# Patient Record
Sex: Male | Born: 1960 | Race: White | Hispanic: No | Smoking: Never smoker
Health system: Southern US, Community
[De-identification: ages and names within clinical notes are randomized; demographics above are authoritative.]

## PROBLEM LIST (undated history)

## (undated) DIAGNOSIS — J9811 Atelectasis: Secondary | ICD-10-CM

## (undated) DIAGNOSIS — M545 Low back pain, unspecified: Secondary | ICD-10-CM

## (undated) DIAGNOSIS — J439 Emphysema, unspecified: Secondary | ICD-10-CM

## (undated) DIAGNOSIS — IMO0002 Reserved for concepts with insufficient information to code with codable children: Secondary | ICD-10-CM

## (undated) DIAGNOSIS — F1010 Alcohol abuse, uncomplicated: Secondary | ICD-10-CM

## (undated) DIAGNOSIS — M75100 Unspecified rotator cuff tear or rupture of unspecified shoulder, not specified as traumatic: Secondary | ICD-10-CM

## (undated) DIAGNOSIS — Z8619 Personal history of other infectious and parasitic diseases: Secondary | ICD-10-CM

## (undated) DIAGNOSIS — K589 Irritable bowel syndrome without diarrhea: Secondary | ICD-10-CM

## (undated) HISTORY — DX: Emphysema, unspecified: J43.9

## (undated) HISTORY — DX: Irritable bowel syndrome without diarrhea: K58.9

## (undated) HISTORY — DX: Atelectasis: J98.11

## (undated) HISTORY — DX: Alcohol abuse, uncomplicated: F10.10

## (undated) HISTORY — PX: EYE SURGERY: SHX253

## (undated) HISTORY — DX: Reserved for concepts with insufficient information to code with codable children: IMO0002

## (undated) HISTORY — DX: Unspecified rotator cuff tear or rupture of unspecified shoulder, not specified as traumatic: M75.100

## (undated) HISTORY — DX: Personal history of other infectious and parasitic diseases: Z86.19

## (undated) HISTORY — DX: Low back pain, unspecified: M54.50

## (undated) HISTORY — DX: Low back pain: M54.5

---

## 2005-03-21 ENCOUNTER — Emergency Department: Payer: Self-pay | Admitting: Emergency Medicine

## 2005-03-22 ENCOUNTER — Other Ambulatory Visit: Payer: Self-pay

## 2005-03-22 ENCOUNTER — Emergency Department: Payer: Self-pay | Admitting: Emergency Medicine

## 2007-05-12 ENCOUNTER — Emergency Department: Payer: Self-pay | Admitting: Emergency Medicine

## 2008-05-27 ENCOUNTER — Ambulatory Visit: Payer: Self-pay | Admitting: Family Medicine

## 2008-08-23 ENCOUNTER — Emergency Department: Payer: Self-pay | Admitting: Emergency Medicine

## 2008-08-26 ENCOUNTER — Ambulatory Visit: Payer: Self-pay | Admitting: Ophthalmology

## 2008-09-09 ENCOUNTER — Ambulatory Visit: Payer: Self-pay | Admitting: Ophthalmology

## 2008-11-30 ENCOUNTER — Emergency Department: Payer: Self-pay | Admitting: Emergency Medicine

## 2008-12-01 ENCOUNTER — Emergency Department: Payer: Self-pay | Admitting: Emergency Medicine
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
## 2009-07-14 ENCOUNTER — Ambulatory Visit: Payer: Self-pay | Admitting: Ophthalmology

## 2009-07-26 ENCOUNTER — Ambulatory Visit: Payer: Self-pay | Admitting: Ophthalmology
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
## 2010-04-08 ENCOUNTER — Ambulatory Visit: Payer: Self-pay | Admitting: General Surgery

## 2010-04-08 HISTORY — PX: SPHINCTEROTOMY: SHX5279

## 2010-04-08 HISTORY — PX: HEMORROIDECTOMY: SUR656

## 2010-04-08 LAB — HM COLONOSCOPY

## 2011-01-07 ENCOUNTER — Inpatient Hospital Stay: Payer: Self-pay | Admitting: Psychiatry

## 2011-07-31 ENCOUNTER — Encounter: Payer: Self-pay | Admitting: Pulmonary Disease

## 2011-08-01 ENCOUNTER — Encounter: Payer: Self-pay | Admitting: Pulmonary Disease

## 2011-08-01 ENCOUNTER — Ambulatory Visit (INDEPENDENT_AMBULATORY_CARE_PROVIDER_SITE_OTHER): Payer: 59 | Admitting: Pulmonary Disease

## 2011-08-01 VITALS — BP 150/98 | HR 77 | Temp 97.9°F | Ht 71.0 in | Wt 218.4 lb

## 2011-08-01 DIAGNOSIS — R9389 Abnormal findings on diagnostic imaging of other specified body structures: Secondary | ICD-10-CM | POA: Insufficient documentation

## 2011-08-01 DIAGNOSIS — R05 Cough: Secondary | ICD-10-CM

## 2011-08-01 DIAGNOSIS — J439 Emphysema, unspecified: Secondary | ICD-10-CM

## 2011-08-01 DIAGNOSIS — R918 Other nonspecific abnormal finding of lung field: Secondary | ICD-10-CM

## 2011-08-01 NOTE — Assessment & Plan Note (Signed)
Chronic cough, or cough lasting longer than 8 weeks is due to any of the following conditions in nearly 95% of cases: upper airway cough syndrome, GERD, asthma, chronic bronchitis, bronchiectasis, and non asthmatic eosinophilic bronchitis.  Smoking and ACE-Inhibitors are other common causes to consider.    Upper Airway Cough Syndrome, or UACS, is caused by either rhinitis (allergic, drug induced, vasomotor rhinitis, environmental irritant), or sinusitis (inflammatory, bacterial, or other infectious).  Effective forms of therapies include nasal steroids, antihistamines, decongestants, ipratropium nasal spray, and saline rinses.  Often the diagnosis is confirmed by response to treatment, and failure to respond to therapy necessitates further diagnostic work up such as CT imaging of the sinuses or 24 hour esophageal impedence studies.    Mr. Douglas Brooks cough is likely due to his ongoing sinus congestion and drainage as well as acid reflux.  I have advised that he start using saline rinses, and chlortrimeton with a decongestant.  I have also recommended that he use delsym as needed.  We gave him lifestyle instructions for his acid reflux and have recommended that he start taking omeprazole 20mg  daily.

## 2011-08-01 NOTE — Patient Instructions (Signed)
We will send you for a scan of your chest at [facility] and call you with the results.  Use Neil Med rinses with distilled water at least twice per day using the instructions on the package. 1/2 hour after using the [facility] Med rinse, use Nasonex two puffs in each nostril once per day. Use chlortrimeton and an over the counter decongestant (ask the pharmacist for a recommendation) as needed for the cough. If you are not better in 2 weeks, then start taking Prilosec 20mg  oral daily. Follow the reflux diet we have provided you. Use Delsym as needed for cough.  We will see you back in 4-6 weeks.

## 2011-08-01 NOTE — Assessment & Plan Note (Signed)
Unfortunately I am not able to view the 13cm bullae that radiology saw on the 06/2011 CXR. However, I am able to see a 2008 CXR from [facility] which shows no such lesion.  I explained to him that I think that this likely represents a healing infection from a prior cavitary pneumonia, but could represent a malignancy or much less likely a more rare cyst forming process in the lung [facility] [place], dermatofibrosarcoma, etc.).    Because he drinks regularly and tells me that he has passed out from drinking at least once since 2008, I favor this representing the resolution of a cavitary pneumonia.  We will order a CT scan to better characterize the lesion and to rule out a mass.  He may need follow up with thoracic surgery.

## 2011-08-01 NOTE — Progress Notes (Signed)
Subjective:    Patient ID: Douglas Brooks, male    DOB: 1961/05/14, 51 y.o.   MRN: [id number]  HPI 51 y/o male with no known lung disease presents to our clinic for evaluation of cough and an abnormal chest x-ray.  He states that he had a normal childhood without significant respiratory illnesses and has never been diagnosed with pneumonia or other respiratory problems.  However in November of 2012 he noted the onset of flu like symptoms (fever, chills, body aches) and eventually sought care with his primary care physician.  He was treated empirically with augmentin, and then when his symptoms did not improve he was treated several weeks later with Levaquin (07/12/2011).  He completed this course but was referred to [place] because a CXR peformed at that time showed a large, 13cm bullae in his left lower lobe with associated atelectasis.  He notes that the cough and chest congestion have not completely resolved. He notes continuous sinus congestion but does not take anything for this.  He states that he has reflux symptoms several times per month, and takes a zantac for it as needed.  He notes an itching sensation in his throat and states that his chest congestion and sputum production worsens whenever he is recumbent.  Past Medical History  Diagnosis Date  . Bulla of lung   . Atelectasis of left lung   . Lumbago   . Other specified disorders of rotator cuff syndrome of shoulder and allied disorders   . Alcohol abuse, unspecified   . Depressive disorder   . Lateral epicondylitis  of elbow   . IBS (irritable bowel syndrome)   . Mixed hyperlipidemia      No family history on file.   History   Social History  . Marital Status: Single    Spouse Name: N/A    Number of Children: 2  . Years of Education: N/A   Occupational History  . 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 Smoking status: Current Some Day Smoker  . Smokeless tobacco: Never Used   Comment: smokes very  occ   . Alcohol Use: No     prior ETOH abuse  . Drug Use: No  . Sexually Active: Not on file   Other Topics Concern  . Not on file   Social History Narrative  . No narrative on file     No Known Allergies   No outpatient prescriptions prior to visit.    Review of Systems  Constitutional: Positive for appetite change. Negative for fever, chills, activity change and unexpected weight change.  HENT: Negative for congestion, sore throat, rhinorrhea, sneezing, trouble swallowing, dental problem, voice change and postnasal drip.   Eyes: Negative for visual disturbance.  Respiratory: Positive for cough. Negative for choking and shortness of breath.   Cardiovascular: Negative for chest pain and leg swelling.  Gastrointestinal: Negative for nausea, vomiting and abdominal pain.  Genitourinary: Negative for difficulty urinating.  Musculoskeletal: Negative for arthralgias.  Skin: Negative for rash.  Psychiatric/Behavioral: Negative for behavioral problems and confusion.      Objective:   Physical Exam Filed Vitals:   08/01/11 1602  BP: 150/98  Pulse: 77  Temp: 97.9 F (36.6 C)  TempSrc: Oral  Height: 5\' 11"  (1.803 m)  Weight: 218 lb 6.4 oz (99.066 kg)  SpO2: 93%   Gen: overweight white male, no acute distress HEENT: NCAT, PERRL, EOMi, OP clear, TM's clear Neck supple without masses PULM: Few insp  crackles in left base, otherwise clear CV: RRR, no mgr, no JVD AB: BS+, soft, nontender, no hsm Ext: warm, no edema, no clubbing, no cyanosis Derm: no rash or skin breakdown Neuro: A&Ox4, CN II-XII intact, strength 5/5 in all 4 extremities  Review of 2008 CXR: no acute lung disease  The Jan 2013 CXR from [facility] notes a 13cm cyst with associated atelectasis in the left lower lobe     Assessment & Plan:   Left lower lobe bullae Unfortunately I am not able to view the 13cm bullae that radiology saw on the 06/2011 CXR. However, I am able to see a 2008 CXR from [facility]  which shows no such lesion.  I explained to him that I think that this likely represents a healing infection from a prior cavitary pneumonia, but could represent a malignancy or much less likely a more rare cyst forming process in the lung [facility] [place], dermatofibrosarcoma, etc.).    Because he drinks regularly and tells me that he has passed out from drinking at least once since 2008, I favor this representing the resolution of a cavitary pneumonia.  We will order a CT scan to better characterize the lesion and to rule out a mass.  He may need follow up with thoracic surgery.  Cough Chronic cough, or cough lasting longer than 8 weeks is due to any of the following conditions in nearly 95% of cases: upper airway cough syndrome, GERD, asthma, chronic bronchitis, bronchiectasis, and non asthmatic eosinophilic bronchitis.  Smoking and ACE-Inhibitors are other common causes to consider.    Upper Airway Cough Syndrome, or UACS, is caused by either rhinitis (allergic, drug induced, vasomotor rhinitis, environmental irritant), or sinusitis (inflammatory, bacterial, or other infectious).  Effective forms of therapies include nasal steroids, antihistamines, decongestants, ipratropium nasal spray, and saline rinses.  Often the diagnosis is confirmed by response to treatment, and failure to respond to therapy necessitates further diagnostic work up such as CT imaging of the sinuses or 24 hour esophageal impedence studies.    Mr. Babette Relic cough is likely due to his ongoing sinus congestion and drainage as well as acid reflux.  I have advised that he start using saline rinses, and chlortrimeton with a decongestant.  I have also recommended that he use delsym as needed.  We gave him lifestyle instructions for his acid reflux and have recommended that he start taking omeprazole 20mg  daily.    Updated Medication List Outpatient Encounter Prescriptions as of 08/01/2011  Medication Sig Dispense Refill  . ABILIFY  2 MG tablet Take 1 tablet by mouth daily.      Marland Kitchen LORazepam (ATIVAN) 0.5 MG tablet Take 1 tablet by mouth Twice daily as needed.      . sertraline (ZOLOFT) 100 MG tablet Take 1 tablet by mouth Twice daily.

## 2011-08-04 ENCOUNTER — Ambulatory Visit: Payer: Self-pay | Admitting: Pulmonary Disease

## 2011-08-07 ENCOUNTER — Telehealth: Payer: Self-pay | Admitting: Pulmonary Disease

## 2011-08-07 NOTE — Telephone Encounter (Signed)
Spoke with pt. I advised that Dr. Kendrick Fries out of the office until 08/21/11 and I will forward him msg to let him know that he is requesting results. Please advise, thanks!

## 2011-08-07 NOTE — Telephone Encounter (Signed)
Pt called wants to get results of his ct he had done friday 28/13 [phone]

## 2011-08-08 ENCOUNTER — Telehealth: Payer: Self-pay | Admitting: Pulmonary Disease

## 2011-08-08 NOTE — Telephone Encounter (Signed)
Called pt this morning to discuss CT results.  He needs a f/u CT and appointment in 3 months.

## 2011-08-08 NOTE — Telephone Encounter (Signed)
I called Douglas Brooks to let him know that his CT at [facility] was remarkably normal.  He had a few areas of atelectasis that need follow up in 3 months.

## 2011-08-28 ENCOUNTER — Telehealth: Payer: Self-pay | Admitting: *Deleted

## 2011-08-28 DIAGNOSIS — J9811 Atelectasis: Secondary | ICD-10-CM

## 2011-08-28 NOTE — Telephone Encounter (Signed)
Message copied by Christen Butter on Mon Aug 28, 2011  3:56 PM ------      Message from: Tanna Savoy      Created: Tue Aug 08, 2011  8:06 AM       L,            I called Mr. Germano to let him know his CT looked OK.  He needs a follow up CT chest in 3 months with an office visit.  Reason for CT: follow up left lower lobe atelectasis seen on 07/2011 study.            He doesn't need to see me until after the CT.            Thanks,      B

## 2011-08-28 NOTE — Telephone Encounter (Signed)
LMTCB- I already went ahead and placed order for CT Chest to be done in May

## 2011-08-29 NOTE — Telephone Encounter (Signed)
LMTCB for pt 

## 2011-08-30 ENCOUNTER — Ambulatory Visit (INDEPENDENT_AMBULATORY_CARE_PROVIDER_SITE_OTHER): Payer: 59 | Admitting: Pulmonary Disease

## 2011-08-30 ENCOUNTER — Encounter: Payer: Self-pay | Admitting: *Deleted

## 2011-08-30 DIAGNOSIS — R918 Other nonspecific abnormal finding of lung field: Secondary | ICD-10-CM

## 2011-08-30 NOTE — Telephone Encounter (Signed)
LMOM for pt that he really does not need to see [place] until after f/u CT Chest in May- he has appt scheduled for this pm however. Asked him to call back asap so we can discuss f/u for May.

## 2011-08-30 NOTE — Telephone Encounter (Signed)
Pt did not show up for his appt today, so letter was mailed to him asking him to call us so that we can coordinate ct chest and ov for May.

## 2011-08-30 NOTE — Progress Notes (Deleted)
  Subjective:    Patient ID: Douglas Brooks, male    DOB: 07-Feb-1961, 52 y.o.   MRN: [id number]  Synopsis: Mr. Douglas Brooks was referred to the [facility] clinic in January 2013 for evaluation of a large left bullae which was noted on CXR following a respiratory infection.  He underwent a CT scan after we saw him and no such bullae could be seen, but there were some small areas of atelectasis and scarring in the left lower lobe.  He was also seen for cough which was felt to be due to UACS and GERD so antihistamine/decongestant and a PPI were prescribed.  HPI 08/30/11 ROV --  Past Medical History  Diagnosis Date  . Bulla of lung   . Atelectasis of left lung   . Lumbago   . Other specified disorders of rotator cuff syndrome of shoulder and allied disorders   . Alcohol abuse, unspecified   . Depressive disorder   . Lateral epicondylitis  of elbow   . IBS (irritable bowel syndrome)   . Mixed hyperlipidemia       Outpatient Prescriptions Prior to Visit  Medication Sig Dispense Refill  . ABILIFY 2 MG tablet Take 1 tablet by mouth daily.      Marland Kitchen LORazepam (ATIVAN) 0.5 MG tablet Take 1 tablet by mouth Twice daily as needed.      . sertraline (ZOLOFT) 100 MG tablet Take 1 tablet by mouth Twice daily.        Review of Systems  Constitutional: Positive for appetite change. Negative for fever, chills, activity change and unexpected weight change.  HENT: Negative for congestion, sore throat, rhinorrhea, sneezing, trouble swallowing, dental problem, voice change and postnasal drip.   Eyes: Negative for visual disturbance.  Respiratory: Positive for cough. Negative for choking and shortness of breath.   Cardiovascular: Negative for chest pain and leg swelling.  Gastrointestinal: Negative for nausea, vomiting and abdominal pain.  Genitourinary: Negative for difficulty urinating.  Musculoskeletal: Negative for arthralgias.  Skin: Negative for rash.  Psychiatric/Behavioral: Negative for  behavioral problems and confusion.      Objective:   Physical Exam  There were no vitals filed for this visit. Gen: overweight white male, no acute distress HEENT: NCAT, PERRL, EOMi, OP clear, TM's clear Neck supple without masses PULM: Few insp crackles in left base, otherwise clear CV: RRR, no mgr, no JVD AB: BS+, soft, nontender, no hsm Ext: warm, no edema, no clubbing, no cyanosis Derm: no rash or skin breakdown Neuro: A&Ox4, CN II-XII intact, strength 5/5 in all 4 extremities  Review of 2008 CXR: no acute lung disease  The Jan 2013 CXR from [facility] notes a 13cm cyst with associated atelectasis in the left lower lobe     Assessment & Plan:   No problem-specific assessment & plan notes found for this encounter.   Updated Medication List Outpatient Encounter Prescriptions as of 08/30/2011  Medication Sig Dispense Refill  . ABILIFY 2 MG tablet Take 1 tablet by mouth daily.      Marland Kitchen LORazepam (ATIVAN) 0.5 MG tablet Take 1 tablet by mouth Twice daily as needed.      . sertraline (ZOLOFT) 100 MG tablet Take 1 tablet by mouth Twice daily.

## 2011-08-31 ENCOUNTER — Encounter: Payer: Self-pay | Admitting: Pulmonary Disease

## 2011-10-11 NOTE — Progress Notes (Signed)
No show

## 2011-10-27 ENCOUNTER — Telehealth: Payer: Self-pay | Admitting: Pulmonary Disease

## 2011-10-27 NOTE — Telephone Encounter (Signed)
I called and spoke with Douglas Brooks and explained that f/u on this lesion is important and despite that his cough has resolved, and he feels well, the CT Chest should be done for completeness, and to be sure that whatever was going on on prior films has indeed resolved and make sure there is no mass there. Douglas Brooks verbalized understanding of this, and states "oh, it'll be all right, I am fine". He does not want the ct done. Will forward to Dr. Kendrick Fries to make him aware.

## 2011-10-30 NOTE — Telephone Encounter (Signed)
noted 

## 2015-03-13 ENCOUNTER — Other Ambulatory Visit: Payer: Self-pay | Admitting: Family Medicine

## 2015-03-14 NOTE — Telephone Encounter (Signed)
May call in 30 day refill for lorazepam, patient needs to schedule office visit before any additional refills can be approved.

## 2015-03-15 NOTE — Telephone Encounter (Signed)
Rx called into pharmacy. Also left message on vm for pt to call back to schedule office visit.

## 2015-03-31 ENCOUNTER — Telehealth: Payer: Self-pay

## 2015-03-31 ENCOUNTER — Encounter: Payer: Self-pay | Admitting: Family Medicine

## 2015-03-31 ENCOUNTER — Ambulatory Visit (INDEPENDENT_AMBULATORY_CARE_PROVIDER_SITE_OTHER): Payer: Self-pay | Admitting: Family Medicine

## 2015-03-31 VITALS — BP 160/84 | HR 59 | Temp 98.8°F | Resp 16 | Ht 71.0 in | Wt 209.0 lb

## 2015-03-31 DIAGNOSIS — E782 Mixed hyperlipidemia: Secondary | ICD-10-CM

## 2015-03-31 DIAGNOSIS — F1011 Alcohol abuse, in remission: Secondary | ICD-10-CM | POA: Insufficient documentation

## 2015-03-31 DIAGNOSIS — R030 Elevated blood-pressure reading, without diagnosis of hypertension: Secondary | ICD-10-CM

## 2015-03-31 DIAGNOSIS — E781 Pure hyperglyceridemia: Secondary | ICD-10-CM | POA: Insufficient documentation

## 2015-03-31 DIAGNOSIS — F32A Depression, unspecified: Secondary | ICD-10-CM | POA: Insufficient documentation

## 2015-03-31 DIAGNOSIS — F329 Major depressive disorder, single episode, unspecified: Secondary | ICD-10-CM

## 2015-03-31 DIAGNOSIS — M545 Low back pain, unspecified: Secondary | ICD-10-CM | POA: Insufficient documentation

## 2015-03-31 DIAGNOSIS — K648 Other hemorrhoids: Secondary | ICD-10-CM | POA: Insufficient documentation

## 2015-03-31 DIAGNOSIS — I10 Essential (primary) hypertension: Secondary | ICD-10-CM | POA: Insufficient documentation

## 2015-03-31 DIAGNOSIS — M67919 Unspecified disorder of synovium and tendon, unspecified shoulder: Secondary | ICD-10-CM | POA: Insufficient documentation

## 2015-03-31 DIAGNOSIS — K589 Irritable bowel syndrome without diarrhea: Secondary | ICD-10-CM | POA: Insufficient documentation

## 2015-03-31 DIAGNOSIS — F1010 Alcohol abuse, uncomplicated: Secondary | ICD-10-CM

## 2015-03-31 DIAGNOSIS — R9389 Abnormal findings on diagnostic imaging of other specified body structures: Secondary | ICD-10-CM | POA: Insufficient documentation

## 2015-03-31 DIAGNOSIS — IMO0001 Reserved for inherently not codable concepts without codable children: Secondary | ICD-10-CM

## 2015-03-31 DIAGNOSIS — G629 Polyneuropathy, unspecified: Secondary | ICD-10-CM

## 2015-03-31 MED ORDER — SERTRALINE HCL 100 MG PO TABS: 100.0000 mg | ORAL_TABLET | Freq: Every day | ORAL | Status: DC

## 2015-03-31 MED ORDER — LORAZEPAM 0.5 MG PO TABS: 0.5000 mg | ORAL_TABLET | Freq: Two times a day (BID) | ORAL | Status: DC

## 2015-03-31 NOTE — Progress Notes (Signed)
Patient: Douglas Brooks Male    DOB: 26-Oct-1960   54 y.o.   MRN: [id number] Visit Date: 03/31/2015  Today's Provider: Mila Merry, MD   Chief Complaint  Patient presents with  . Numbness    left arm   Subjective:    HPI  Started having gradual onset of numbness and burning on the bottoms of both feet about a month ago. Affects both feet equally. Occasionally fingers feel numb, more so on left. Occurs at rest and when on feet. No known injury. Has a sensation like having pieces of corn rubbing on the bottom of his forefoot.  Allergies  Allergen Reactions  . Crestor [Rosuvastatin Calcium]   . Fluvastatin Sodium Other (See Comments)    Muscle pain and weakness  . Lovastatin    Previous Medications   ABILIFY 2 MG TABLET    Take 1 tablet by mouth daily.   LORAZEPAM (ATIVAN) 0.5 MG TABLET    take 1 tablet by mouth twice a day   SERTRALINE (ZOLOFT) 100 MG TABLET    Take 1 tablet by mouth Twice daily.   Family Status  Relation Status Death Age  . Mother Alive   . Father Alive   . Sister Alive   . Brother Alive   . Brother Alive   . Sister Alive   . Sister Alive     His family history includes Hyperlipidemia in his mother.  Review of Systems  Respiratory: Negative for cough, chest tightness and shortness of breath.   Cardiovascular: Negative for chest pain and palpitations.  Musculoskeletal: Negative for back pain, neck pain and neck stiffness.  Neurological: Positive for weakness and numbness. Negative for dizziness, light-headedness and headaches.       Tingling in left hand with some numbness. Also, bilateral numbness and tingling in feet.    Social History  Substance Use Topics  . Smoking status: Never Smoker   . Smokeless tobacco: Never Used     Comment: smokes very occ   . Alcohol Use: 0.0 oz/week    0 Standard drinks or equivalent per week     Comment: occasional use; prior ETOH abuse   Objective:   BP 160/84 mmHg  Pulse 59  Temp(Src) 98.8 F  (37.1 C) (Oral)  Resp 16  Ht  (1.803 m)  Wt 209 lb (94.802 kg)  BMI 29.16 kg/m2  SpO2 98%  Physical Exam  General Appearance:    Alert, cooperative, no distress  Eyes:    PERRL, conjunctiva/corneas clear, EOM's intact       Lungs:     Clear to auscultation bilaterally, respirations unlabored  Heart:    Regular rate and rhythm  Neurologic:   Awake, alert, oriented x 3. Decrease somato-sensation of . Plantar aspects of forefoot of both feet. No gross deformities. No erythema.   Vasc:   No edema, +2 pedal pulses. Cap refill about 4 seconds.       FSBS=91    Assessment & Plan:     1. Neuropathy (HCC) No clear etiology. Denies recent alcohol use.  - Comprehensive metabolic panel - CBC - Vit D  25 hydroxy (rtn osteoporosis monitoring) - B12 and Folate Panel  Consider trail of thiamine if labs normal.   2. Depressive disorder He states sertraline is working well and requests refill for this and lorazepam.   3. Elevated blood pressure Consider ASA and antihypertensive after reviewing labs.        Mila Merry,  MD  [place]

## 2015-03-31 NOTE — Telephone Encounter (Signed)
Patient called saying that he has had some numbness in his left leg for about 3 weeks, and now he is experiencing numbness and tingling in his left arm. Patient also reports that he is having shortness of breath with exertion, along with some mild upper back pain. Patient denies any chest pain, dizziness, nausea, or lightheadedness. I recommended to patient that he be seen in the ER, but patient declined. Patient wanted to come into the office to be seen. Advised patient that the earliest appt wasn't until 10:45a. Patient reports that he would like to be seen by Dr. Sherrie Mustache first. Scheduled patient an appt to be seen today. Advised patient if he develops severe chest pain, dizziness, or shortness of breath, he should go to the ER.

## 2015-04-01 DIAGNOSIS — G629 Polyneuropathy, unspecified: Secondary | ICD-10-CM | POA: Insufficient documentation

## 2015-04-01 LAB — COMPREHENSIVE METABOLIC PANEL
ALT: 25 IU/L (ref 0–44)
AST: 22 IU/L (ref 0–40)
Albumin/Globulin Ratio: 2 (ref 1.1–2.5)
Albumin: 4.9 g/dL (ref 3.5–5.5)
Alkaline Phosphatase: 77 IU/L (ref 39–117)
BUN/Creatinine Ratio: 15 (ref 9–20)
BUN: 11 mg/dL (ref 6–24)
Bilirubin Total: 0.8 mg/dL (ref 0.0–1.2)
CALCIUM: 9.9 mg/dL (ref 8.7–10.2)
CO2: 26 mmol/L (ref 18–29)
CREATININE: 0.75 mg/dL — AB (ref 0.76–1.27)
Chloride: 100 mmol/L (ref 97–108)
GFR calc Af Amer: 119 mL/min/{1.73_m2} (ref >59)
GFR, EST NON AFRICAN AMERICAN: 103 mL/min/{1.73_m2} (ref >59)
Globulin, Total: 2.5 g/dL (ref 1.5–4.5)
Glucose: 109 mg/dL — ABNORMAL HIGH (ref 65–99)
Potassium: 5.6 mmol/L — ABNORMAL HIGH (ref 3.5–5.2)
Sodium: 140 mmol/L (ref 134–144)
Total Protein: 7.4 g/dL (ref 6.0–8.5)

## 2015-04-01 LAB — B12 AND FOLATE PANEL
Folate: 12 ng/mL (ref >3.0)
Vitamin B-12: 376 pg/mL (ref 211–946)

## 2015-04-01 LAB — CBC
HEMOGLOBIN: 15.9 g/dL (ref 12.6–17.7)
Hematocrit: 45.4 % (ref 37.5–51.0)
MCH: 32.1 pg (ref 26.6–33.0)
MCHC: 35 g/dL (ref 31.5–35.7)
MCV: 92 fL (ref 79–97)
Platelets: 194 10*3/uL (ref 150–379)
RBC: 4.95 x10E6/uL (ref 4.14–5.80)
RDW: 13.6 % (ref 12.3–15.4)
WBC: 5.2 10*3/uL (ref 3.4–10.8)

## 2015-04-01 LAB — VITAMIN D 25 HYDROXY (VIT D DEFICIENCY, FRACTURES): VIT D 25 HYDROXY: 22.6 ng/mL — AB (ref 30.0–100.0)

## 2015-05-27 ENCOUNTER — Telehealth: Payer: Self-pay

## 2015-05-27 NOTE — Telephone Encounter (Signed)
Douglas [facility] stating that patient is being seen in the office with possible pneumonia. She reports that his blood pressure is really high. She is asking if they can go ahead and start a low dose antihypertensive medication for his BP. Discussed message with Dr. Sullivan LoneGilbert and he reports that it would be ok. Patient was started on HCTZ 12.5mg  daily and she Mel Almond(Douglas Brooks) reports that patient will follow up with our office in 1-2 days for re-evaluation.

## 2015-06-15 ENCOUNTER — Telehealth: Payer: Self-pay | Admitting: Family Medicine

## 2015-06-15 NOTE — Telephone Encounter (Signed)
Please review-aa 

## 2015-06-15 NOTE — Telephone Encounter (Signed)
Pt called saying the neuropathy in his feet has gotten really bad.  His toes have become somewhat numb and cold.  He said he was in a lot of pain.  He does not have insurance and said he was just in about a month ago for this.  It's just getting worse.  His call back is [phone]  Thanks Barth Kirkseri

## 2015-06-16 MED ORDER — NORTRIPTYLINE HCL 25 MG PO CAPS: 25.0000 mg | ORAL_CAPSULE | Freq: Every day | ORAL | Status: DC

## 2015-06-16 NOTE — Telephone Encounter (Signed)
LMOVM for pt to return call 

## 2015-06-16 NOTE — Telephone Encounter (Signed)
Patient was notified. Patient expressed understanding. Rx was sent to pharmacy. 

## 2015-06-16 NOTE — Telephone Encounter (Signed)
Can start nortriptyline 25mg  at bedtime, #30, rf x 1. Make sure he taking the vitamin d supplement 2000 units a day because his vitamin d level was very low which can aggravate neuropathy  His BP was high at his last visit and needs to schedule follow up in one to follow up on new medication and blood pressure.

## 2015-09-15 ENCOUNTER — Ambulatory Visit (INDEPENDENT_AMBULATORY_CARE_PROVIDER_SITE_OTHER): Payer: Self-pay | Admitting: Family Medicine

## 2015-09-15 ENCOUNTER — Encounter: Payer: Self-pay | Admitting: Family Medicine

## 2015-09-15 VITALS — BP 185/116 | HR 81 | Temp 98.6°F | Resp 16 | Ht 71.0 in | Wt 210.0 lb

## 2015-09-15 DIAGNOSIS — I10 Essential (primary) hypertension: Secondary | ICD-10-CM

## 2015-09-15 DIAGNOSIS — G629 Polyneuropathy, unspecified: Secondary | ICD-10-CM

## 2015-09-15 DIAGNOSIS — E559 Vitamin D deficiency, unspecified: Secondary | ICD-10-CM

## 2015-09-15 MED ORDER — LORAZEPAM 0.5 MG PO TABS: 0.5000 mg | ORAL_TABLET | Freq: Two times a day (BID) | ORAL | Status: DC

## 2015-09-15 MED ORDER — LISINOPRIL-HYDROCHLOROTHIAZIDE 10-12.5 MG PO TABS: 1.0000 | ORAL_TABLET | Freq: Every day | ORAL | Status: DC

## 2015-09-15 MED ORDER — NORTRIPTYLINE HCL 25 MG PO CAPS: 25.0000 mg | ORAL_CAPSULE | Freq: Every day | ORAL | Status: DC

## 2015-09-15 NOTE — Progress Notes (Signed)
Patient: Douglas Brooks Male    DOB: 09/19/1960   55 y.o.   MRN: [id number] Visit Date: 09/15/2015  Today's Provider: Mila Merryonald Aliayah Tyer, MD   Chief Complaint  Patient presents with  . Blood Pressure Check   Subjective:    HPI  Patient was seen in dentist office yesterday for a toothache. Dentist was going to extract the tooth but patient's blood pressure was elevated to 190/107. Dentist will not extract te tooth until his blood pressure is lower and advised him to see his provider today. He has not been following low sodium diet and not exercising.    Allergies  Allergen Reactions  . Crestor [Rosuvastatin Calcium]   . Fluvastatin Sodium Other (See Comments)    Muscle pain and weakness  . Lovastatin    Previous Medications   HYDROCODONE-ACETAMINOPHEN (NORCO/VICODIN) 5-325 MG TABLET    Take 1 tablet by mouth every 6 (six) hours as needed for moderate pain.   LORAZEPAM (ATIVAN) 0.5 MG TABLET    Take 1 tablet (0.5 mg total) by mouth 2 (two) times daily.   NORTRIPTYLINE (PAMELOR) 25 MG CAPSULE    Take 1 capsule (25 mg total) by mouth at bedtime.    Review of Systems  Constitutional: Negative for fever, chills and appetite change.  Respiratory: Negative for chest tightness, shortness of breath and wheezing.   Cardiovascular: Negative for chest pain and palpitations.  Gastrointestinal: Negative for nausea, vomiting and abdominal pain.    Social History  Substance Use Topics  . Smoking status: Never Smoker   . Smokeless tobacco: Never Used     Comment: smokes very occ   . Alcohol Use: 0.0 oz/week    0 Standard drinks or equivalent per week     Comment: occasional use; prior ETOH abuse   Objective:     Filed Vitals:   09/15/15 1506 09/15/15 1601  BP: 170/110 185/116  Pulse: 81   Temp: 98.6 F (37 C)   TempSrc: Oral   Resp: 16   Height: 5\' 11"  (1.803 m)   Weight: 210 lb (95.255 kg)   SpO2: 97%      Physical Exam   General Appearance:    Alert,  cooperative, no distress  Eyes:    PERRL, conjunctiva/corneas clear, EOM's intact       Lungs:     Clear to auscultation bilaterally, respirations unlabored  Heart:    Regular rate and rhythm  Neurologic:   Awake, alert, oriented x 3. No apparent focal neurological           defect.            Assessment & Plan:     1. Essential hypertension Discussed low sodium, high fruit and vegatable diet, work on losing weight. Start lisinopril-hctz. Return after the weekend for BP check. May not have dental procedure until BP remains <180/100.  - lisinopril-hydrochlorothiazide (PRINZIDE,ZESTORETIC) 10-12.5 MG tablet; Take 1 tablet by mouth daily.  Dispense: 30 tablet; Refill: 1 - LORazepam (ATIVAN) 0.5 MG tablet; Take 1 tablet (0.5 mg total) by mouth 2 (two) times daily.  Dispense: 60 tablet; Refill: 1  2. Vitamin D deficiency On vitamin d supplements per patient report  3. Neuropathy (HCC) Nortriptyline working well. Stay on vitamin d replacement. - nortriptyline (PAMELOR) 25 MG capsule; Take 1 capsule (25 mg total) by mouth at bedtime.  Dispense: 30 capsule; Refill: 1        Mila Merryonald Calee Nugent, MD  [facility]

## 2015-10-01 ENCOUNTER — Other Ambulatory Visit: Payer: Self-pay | Admitting: Family Medicine

## 2015-10-01 NOTE — Telephone Encounter (Signed)
Please call in lorazepam.  

## 2015-10-01 NOTE — Telephone Encounter (Signed)
Prescription phoned into pharmacy.

## 2015-12-29 ENCOUNTER — Ambulatory Visit: Payer: Self-pay | Admitting: Family Medicine

## 2015-12-29 ENCOUNTER — Ambulatory Visit (INDEPENDENT_AMBULATORY_CARE_PROVIDER_SITE_OTHER): Payer: Self-pay | Admitting: Family Medicine

## 2015-12-29 ENCOUNTER — Encounter: Payer: Self-pay | Admitting: Family Medicine

## 2015-12-29 VITALS — BP 130/80 | HR 92 | Temp 97.9°F | Resp 16 | Ht 71.0 in | Wt 201.0 lb

## 2015-12-29 DIAGNOSIS — R109 Unspecified abdominal pain: Secondary | ICD-10-CM

## 2015-12-29 DIAGNOSIS — M62838 Other muscle spasm: Secondary | ICD-10-CM

## 2015-12-29 LAB — POCT URINALYSIS DIPSTICK
BILIRUBIN UA: NEGATIVE
Ketones, UA: NEGATIVE
LEUKOCYTES UA: NEGATIVE
NITRITE UA: NEGATIVE
PH UA: 6
PROTEIN UA: NEGATIVE
Spec Grav, UA: 1.025
UROBILINOGEN UA: 0.2

## 2015-12-29 MED ORDER — HYOSCYAMINE SULFATE 0.125 MG PO TABS
0.1250 mg | ORAL_TABLET | Freq: Four times a day (QID) | ORAL | Status: DC | PRN
Start: 2015-12-29 — End: 2019-01-13

## 2015-12-29 MED ORDER — MELOXICAM 15 MG PO TABS: 15.0000 mg | ORAL_TABLET | Freq: Every day | ORAL | Status: AC

## 2015-12-29 NOTE — Progress Notes (Signed)
Patient: Douglas Brooks Male    DOB: September 11, 1960   55 y.o.   MRN: [id number] Visit Date: 12/29/2015  Today's Provider: Lelon Huh, MD   Chief Complaint  Patient presents with  . Flank Pain    left   Subjective:     HPI   Follow up ER visit  Patient was seen in [facility] ER for left side flank pain on 12/26/2015. He was treated for Diverticulitis. Treatment for this included CT abdominal scan- Colonic diverticulosis without evidence of diverticulitis. Labs, ekg and UA. Had normal CBC, normal met c except slightly low protein.  Patient was prescribed metroNIDAZOLE (FLAGYL) 500 MG tablet and ciprofloxacin HCl (CIPRO) 500 MG tablet. He reports good compliance with treatment. He reports this condition is Worse. Pain start right lower abdomen, then migrated to left and went to ER on 7/2. Pain has since has since been migrating back and forth between right and left lower quadrant. Is now hurting all across lower abdomen. Had diarrhea the first couple of days, but has normal BM the last 2 days. Has been taking OTC tylenol without adverse effects. Eating makes him nauseated, but does not exacerbate pain. He also states he has been having occasional episodes of spasms in abdomen for the last 6 or 7 months, although they usually feel different than current symptoms.     ----------------------------------------------------------------------    Allergies  Allergen Reactions  . Crestor [Rosuvastatin Calcium]   . Fluvastatin Sodium Other (See Comments)    Muscle pain and weakness  . Lovastatin    Current Meds  Medication Sig  . ciprofloxacin (CIPRO) 500 MG tablet Take 500 mg by mouth 2 (two) times daily.  Marland Kitchen HYDROcodone-acetaminophen (NORCO/VICODIN) 5-325 MG tablet Take 1 tablet by mouth every 6 (six) hours as needed for moderate pain.  Marland Kitchen lisinopril-hydrochlorothiazide (PRINZIDE,ZESTORETIC) 10-12.5 MG tablet Take 1 tablet by mouth daily.  Marland Kitchen LORazepam (ATIVAN) 0.5 MG tablet take 1 tablet  by mouth twice a day  . metroNIDAZOLE (FLAGYL) 500 MG tablet Take 1 tablet by mouth 2 (two) times daily.  . nortriptyline (PAMELOR) 25 MG capsule Take 1 capsule (25 mg total) by mouth at bedtime.    Review of Systems  Constitutional: Negative for fever, chills and appetite change.  Respiratory: Negative for chest tightness, shortness of breath and wheezing.   Cardiovascular: Negative for chest pain and palpitations.  Gastrointestinal: Negative for nausea, vomiting and abdominal pain.  Genitourinary: Positive for flank pain.    Social History  Substance Use Topics  . Smoking status: Never Smoker   . Smokeless tobacco: Never Used     Comment: smokes very occ   . Alcohol Use: 0.0 oz/week    0 Standard drinks or equivalent per week     Comment: occasional use; prior ETOH abuse   Objective:   BP 130/80 mmHg  Pulse 92  Temp(Src) 97.9 F (36.6 C) (Oral)  Resp 16  Ht '5\' 11"'$  (1.803 m)  Wt 201 lb (91.173 kg)  BMI 28.05 kg/m2  SpO2 100%  Physical Exam General Appearance:    Alert, cooperative, no distress  Eyes:    PERRL, conjunctiva/corneas clear, EOM's intact       Lungs:     Clear to auscultation bilaterally, respirations unlabored  Heart:    Regular rate and rhythm  Abdomen:   bowel sounds present and normal in all 4 quadrants or nondistended. No CVA tenderness. No masses. Positive Carnett's sign. No rebound or guarding.  Results for orders placed or performed in visit on 12/29/15  POCT urinalysis dipstick  Result Value Ref Range   Color, UA yellow    Clarity, UA clear    Glucose, UA Trace    Bilirubin, UA Negative    Ketones, UA Negative    Spec Grav, UA 1.025    Blood, UA Trace (non hemolyzef)    pH, UA 6.0    Protein, UA Negative    Urobilinogen, UA 0.2    Nitrite, UA Negative    Leukocytes, UA Negative Negative       Assessment & Plan:     1. Muscle spasm  - hyoscyamine (LEVSIN, ANASPAZ) 0.125 MG tablet; Take 1 tablet (0.125 mg total) by mouth  every 6 (six) hours as needed.  Dispense: 60 tablet; Refill: 0 - meloxicam (MOBIC) 15 MG tablet; Take 1 tablet (15 mg total) by mouth daily. Take with food  Dispense: 15 tablet; Refill: 0  2. Abdominal pain, unspecified abdominal location Considering positive Carnett's sign I think this is likely musculoskela. CT at [facility] did show diverticulosis, but no diverticulitis and migrating nature of pain is not really consistent with diverticulitis.  - POCT urinalysis dipstick        Lelon Huh, MD  [facility]

## 2016-01-06 ENCOUNTER — Ambulatory Visit: Payer: Self-pay | Admitting: Family Medicine

## 2016-01-07 ENCOUNTER — Encounter: Payer: Self-pay | Admitting: Family Medicine

## 2016-01-07 ENCOUNTER — Ambulatory Visit (INDEPENDENT_AMBULATORY_CARE_PROVIDER_SITE_OTHER): Payer: Self-pay | Admitting: Family Medicine

## 2016-01-07 VITALS — BP 144/98 | HR 79 | Temp 97.9°F | Resp 20 | Wt 204.0 lb

## 2016-01-07 DIAGNOSIS — R1030 Lower abdominal pain, unspecified: Secondary | ICD-10-CM

## 2016-01-07 MED ORDER — PREDNISONE 10 MG PO TABS: ORAL_TABLET | ORAL | Status: AC

## 2016-01-07 NOTE — Progress Notes (Signed)
Patient: Douglas Brooks Male    DOB: 1961-06-07   55 y.o.   MRN: [id number] Visit Date: 01/07/2016  Today's Provider: Mila Merry, MD   Chief Complaint  Patient presents with  . Constipation   Subjective:    Constipation This is a new problem. Episode onset: 3- 4 days ago. The problem has been gradually improving since onset. The stool is described as firm. Associated symptoms include abdominal pain, bloating and flatus. Pertinent negatives include no anorexia, back pain, diarrhea, difficulty urinating, fecal incontinence, fever, hematochezia, melena, nausea, rectal pain or vomiting. He has tried laxatives for the symptoms. The treatment provided significant relief.   He was seen 12/29/2015 for lower abdominal pain. He had previously been at [facility] ER and had CT showing diverticulosis, but no diverticulitis. They prescribed abx which did not help symptoms. Pain has been migrating from RLQ to LLQ and states it feels like something tearing whenever he moves certain ways and especially when lifting heaving objects. He was prescribed hyoscyamine and meloxicam at his visit on 12-2015 and he states the hyoscyamine helps quite a bit, but only for a few hours. No n/v/c/f.     Allergies  Allergen Reactions  . Crestor [Rosuvastatin Calcium]   . Fluvastatin Sodium Other (See Comments)    Muscle pain and weakness  . Lovastatin    Current Meds  Medication Sig  . HYDROcodone-acetaminophen (NORCO/VICODIN) 5-325 MG tablet Take 1 tablet by mouth every 6 (six) hours as needed for moderate pain.  . hyoscyamine (LEVSIN, ANASPAZ) 0.125 MG tablet Take 1 tablet (0.125 mg total) by mouth every 6 (six) hours as needed.  Marland Kitchen lisinopril-hydrochlorothiazide (PRINZIDE,ZESTORETIC) 10-12.5 MG tablet Take 1 tablet by mouth daily.  Marland Kitchen LORazepam (ATIVAN) 0.5 MG tablet take 1 tablet by mouth twice a day  . meloxicam (MOBIC) 15 MG tablet Take 1 tablet (15 mg total) by mouth daily. Take with food  . nortriptyline  (PAMELOR) 25 MG capsule Take 1 capsule (25 mg total) by mouth at bedtime.    Review of Systems  Constitutional: Positive for fatigue. Negative for fever, chills, diaphoresis and appetite change.  Respiratory: Negative for chest tightness, shortness of breath and wheezing.   Cardiovascular: Negative for chest pain and palpitations.  Gastrointestinal: Positive for abdominal pain, constipation, bloating and flatus. Negative for nausea, vomiting, diarrhea, melena, hematochezia, rectal pain and anorexia.  Genitourinary: Positive for dysuria (burning during urination x 2 days). Negative for difficulty urinating.  Musculoskeletal: Negative for back pain.  Neurological: Negative for dizziness, light-headedness, numbness and headaches.    Social History  Substance Use Topics  . Smoking status: Never Smoker   . Smokeless tobacco: Never Used     Comment: smokes very occ   . Alcohol Use: 0.0 oz/week    0 Standard drinks or equivalent per week     Comment: occasional use; prior ETOH abuse   Objective:   BP 144/98 mmHg  Pulse 79  Temp(Src) 97.9 F (36.6 C) (Oral)  Resp 20  Wt 204 lb (92.534 kg)  SpO2 96%  Physical Exam  Physical Exam General Appearance:    Alert, cooperative, no distress  Eyes:    PERRL, conjunctiva/corneas clear, EOM's intact       Lungs:     Clear to auscultation bilaterally, respirations unlabored  Heart:    Regular rate and rhythm  Abdomen:   bowel sounds present and normal in all 4 quadrants or nondistended. No CVA tenderness. No masses. Positive Carnett's sign.  No rebound or guarding.       Assessment & Plan:     1. Lower abdominal pain Not consistent with diverticulitis and did not improve on antibiotics. He clearly relates exacerbation with physical exertion and had positive Carnett's sign. This is likely abdominal wall problem. Recommend MRI for further evaluation, but patient has no insurance. For now will take him out of work for a couple of days and start  prednisone. He is to go back to ER if any change or worsening of symptoms.        Mila Merryonald Fisher, MD  [facility]

## 2016-02-19 ENCOUNTER — Other Ambulatory Visit: Payer: Self-pay | Admitting: Family Medicine

## 2016-02-19 DIAGNOSIS — I10 Essential (primary) hypertension: Secondary | ICD-10-CM

## 2016-03-31 ENCOUNTER — Other Ambulatory Visit: Payer: Self-pay | Admitting: Family Medicine

## 2016-03-31 NOTE — Telephone Encounter (Signed)
Med called in

## 2016-03-31 NOTE — Telephone Encounter (Signed)
Please call in lorazepam.  

## 2016-06-02 ENCOUNTER — Other Ambulatory Visit: Payer: Self-pay

## 2016-06-02 DIAGNOSIS — G629 Polyneuropathy, unspecified: Secondary | ICD-10-CM

## 2016-06-02 MED ORDER — NORTRIPTYLINE HCL 25 MG PO CAPS: 25.0000 mg | ORAL_CAPSULE | Freq: Every day | ORAL | 1 refills | Status: DC

## 2016-06-02 NOTE — Telephone Encounter (Signed)
[facility] LOV 12/29/15 and last fill was 09/15/15 with 1 refill-aa

## 2016-06-07 ENCOUNTER — Telehealth: Payer: Self-pay | Admitting: Family Medicine

## 2016-06-07 NOTE — Telephone Encounter (Signed)
error 

## 2016-07-26 ENCOUNTER — Other Ambulatory Visit: Payer: Self-pay | Admitting: Family Medicine

## 2016-07-26 NOTE — Telephone Encounter (Signed)
RX called in at [facility] pharmacy  

## 2016-09-01 ENCOUNTER — Other Ambulatory Visit: Payer: Self-pay | Admitting: Family Medicine

## 2016-09-01 DIAGNOSIS — G629 Polyneuropathy, unspecified: Secondary | ICD-10-CM

## 2016-11-14 ENCOUNTER — Other Ambulatory Visit: Payer: Self-pay | Admitting: Family Medicine

## 2016-11-14 DIAGNOSIS — G629 Polyneuropathy, unspecified: Secondary | ICD-10-CM

## 2016-11-27 ENCOUNTER — Other Ambulatory Visit: Payer: Self-pay | Admitting: Family Medicine

## 2016-11-27 NOTE — Telephone Encounter (Signed)
Please call in lorazepam. Please advise patent he is due for follow up office visit and labs.

## 2016-11-27 NOTE — Telephone Encounter (Signed)
rx called in. lmtcb for patient-aa

## 2016-11-30 NOTE — Telephone Encounter (Signed)
Patient scheduled appt for 12/11/16.

## 2016-12-11 ENCOUNTER — Encounter: Payer: Self-pay | Admitting: Family Medicine

## 2016-12-11 ENCOUNTER — Ambulatory Visit (INDEPENDENT_AMBULATORY_CARE_PROVIDER_SITE_OTHER): Payer: Self-pay | Admitting: Family Medicine

## 2016-12-11 VITALS — BP 130/80 | HR 73 | Temp 97.6°F | Resp 16 | Ht 71.0 in | Wt 200.0 lb

## 2016-12-11 DIAGNOSIS — Z1159 Encounter for screening for other viral diseases: Secondary | ICD-10-CM

## 2016-12-11 DIAGNOSIS — Z0000 Encounter for general adult medical examination without abnormal findings: Secondary | ICD-10-CM

## 2016-12-11 DIAGNOSIS — G629 Polyneuropathy, unspecified: Secondary | ICD-10-CM

## 2016-12-11 DIAGNOSIS — E559 Vitamin D deficiency, unspecified: Secondary | ICD-10-CM

## 2016-12-11 DIAGNOSIS — F419 Anxiety disorder, unspecified: Secondary | ICD-10-CM

## 2016-12-11 DIAGNOSIS — I10 Essential (primary) hypertension: Secondary | ICD-10-CM

## 2016-12-11 MED ORDER — LORAZEPAM 0.5 MG PO TABS: 0.5000 mg | ORAL_TABLET | Freq: Two times a day (BID) | ORAL | 5 refills | Status: DC

## 2016-12-11 NOTE — Progress Notes (Signed)
Patient: Douglas ReadyMichael D Woodfield Male    DOB: 02/11/1961   56 y.o.   MRN: [id number] Visit Date: 12/11/2016  Today's Provider: Mila Merryonald Anhelica Fowers, MD   Chief Complaint  Patient presents with  . Follow-up  . Hypertension   Subjective:    HPI   Hypertension, follow-up:  BP Readings from Last 3 Encounters:  12/11/16 140/90  01/07/16 (!) 144/98  12/29/15 130/80    He was last seen for hypertension 3 months ago.  BP at that visit was 185/116. Management since that visit includes; started lisinopril-HTCZ. Dicussed low sodium, high friut and vegetable diet. Work on weight loss.Marland Kitchen.He reports fair compliance with treatment. He is not having side effects. none He is/work exercising. He is not adherent to low salt diet.   Outside blood pressures are not checking. He is experiencing none.  Patient denies none.   Cardiovascular risk factors include none.  Use of agents associated with hypertension: none.   ----------------------------------------------------------------  Vitamin D deficiency From 09/14/2016-no changes. Lab Results  Component Value Date   VD25OH 22.6 (L) 03/31/2015     Neuropathy (HCC) He states numbness remains unchanged. Does not wake at night. No burning or stinging.    Allergies  Allergen Reactions  . Crestor [Rosuvastatin Calcium]   . Fluvastatin Sodium Other (See Comments)    Muscle pain and weakness  . Lovastatin      Current Outpatient Prescriptions:  .  HYDROcodone-acetaminophen (NORCO/VICODIN) 5-325 MG tablet, Take 1 tablet by mouth every 6 (six) hours as needed for moderate pain., Disp: , Rfl:  .  hyoscyamine (LEVSIN, ANASPAZ) 0.125 MG tablet, Take 1 tablet (0.125 mg total) by mouth every 6 (six) hours as needed., Disp: 60 tablet, Rfl: 0 .  lisinopril-hydrochlorothiazide (PRINZIDE,ZESTORETIC) 10-12.5 MG tablet, take 1 tablet by mouth once daily, Disp: 30 tablet, Rfl: 12 .  LORazepam (ATIVAN) 0.5 MG tablet, take 1 tablet by mouth twice a day,  Disp: 60 tablet, Rfl: 0 .  nortriptyline (PAMELOR) 25 MG capsule, take 1 capsule by mouth at bedtime, Disp: 30 capsule, Rfl: 5  Review of Systems  Constitutional: Negative for appetite change, chills and fever.  Respiratory: Negative for chest tightness, shortness of breath and wheezing.   Cardiovascular: Negative for chest pain and palpitations.  Gastrointestinal: Negative for abdominal pain, nausea and vomiting.    Social History  Substance Use Topics  . Smoking status: Never Smoker  . Smokeless tobacco: Never Used     Comment: smokes very occ   . Alcohol use 0.0 oz/week     Comment: occasional use; prior ETOH abuse   Objective:   BP 140/90 (BP Location: Right Arm, Patient Position: Sitting, Cuff Size: Large)   Pulse 73   Temp 97.6 F (36.4 C) (Oral)   Resp 16   Ht 5\' 11"  (1.803 m)   Wt 200 lb (90.7 kg)   SpO2 98%   BMI 27.89 kg/m  Vitals:   12/11/16 0813  BP: 140/90  Pulse: 73  Resp: 16  Temp: 97.6 F (36.4 C)  TempSrc: Oral  SpO2: 98%  Weight: 200 lb (90.7 kg)  Height: 5\' 11"  (1.803 m)   Depression screen [facility] 2/9 12/11/2016  Decreased Interest 0  Down, Depressed, Hopeless 0  PHQ - 2 Score 0  Altered sleeping 0  Tired, decreased energy 0  Change in appetite 0  Feeling bad or failure about yourself  0  Trouble concentrating 0  Moving slowly or fidgety/restless 0  Suicidal thoughts 0  PHQ-9 Score 0  Difficult doing work/chores Not difficult at all     Physical Exam   General Appearance:    Alert, cooperative, no distress  Eyes:    PERRL, conjunctiva/corneas clear, EOM's intact       Lungs:     Clear to auscultation bilaterally, respirations unlabored  Heart:    Regular rate and rhythm  Neurologic:   Awake, alert, oriented x 3. No apparent focal neurological           defect.           Assessment & Plan:     1. Neuropathy Stable   2. Vitamin D deficiency Doing well on current vitamin d supplements - VITAMIN D 25 Hydroxy (Vit-D Deficiency,  Fractures)  3. Essential hypertension Well controlled.  Continue current medications.   - EKG 12-Lead - Comprehensive metabolic panel - Lipid panel  4. . Anxiety Doing well with lorazepam which he usually takes 2 or 3 times a day.       The entirety of the information documented in the History of Present Illness, Review of Systems and Physical Exam were personally obtained by me. Portions of this information were initially documented by April M. Hyacinth Meeker, CMA and reviewed by me for thoroughness and accuracy.    Mila Merry, MD  [facility]

## 2016-12-12 LAB — COMPREHENSIVE METABOLIC PANEL
ALBUMIN: 4.4 g/dL (ref 3.5–5.5)
ALT: 30 IU/L (ref 0–44)
AST: 27 IU/L (ref 0–40)
Albumin/Globulin Ratio: 1.6 (ref 1.2–2.2)
Alkaline Phosphatase: 86 IU/L (ref 39–117)
BILIRUBIN TOTAL: 0.4 mg/dL (ref 0.0–1.2)
BUN / CREAT RATIO: 14 (ref 9–20)
BUN: 11 mg/dL (ref 6–24)
CALCIUM: 9.3 mg/dL (ref 8.7–10.2)
CHLORIDE: 99 mmol/L (ref 96–106)
CO2: 23 mmol/L (ref 20–29)
CREATININE: 0.78 mg/dL (ref 0.76–1.27)
GFR, EST AFRICAN AMERICAN: 116 mL/min/{1.73_m2} (ref >59)
GFR, EST NON AFRICAN AMERICAN: 100 mL/min/{1.73_m2} (ref >59)
GLUCOSE: 107 mg/dL — AB (ref 65–99)
Globulin, Total: 2.7 g/dL (ref 1.5–4.5)
Potassium: 4.7 mmol/L (ref 3.5–5.2)
Sodium: 139 mmol/L (ref 134–144)
TOTAL PROTEIN: 7.1 g/dL (ref 6.0–8.5)

## 2016-12-12 LAB — LIPID PANEL
CHOL/HDL RATIO: 3.9 ratio (ref 0.0–5.0)
Cholesterol, Total: 234 mg/dL — ABNORMAL HIGH (ref 100–199)
HDL: 60 mg/dL (ref >39)
LDL CALC: 131 mg/dL — AB (ref 0–99)
Triglycerides: 215 mg/dL — ABNORMAL HIGH (ref 0–149)
VLDL CHOLESTEROL CAL: 43 mg/dL — AB (ref 5–40)

## 2016-12-12 LAB — VITAMIN D 25 HYDROXY (VIT D DEFICIENCY, FRACTURES): VIT D 25 HYDROXY: 26.4 ng/mL — AB (ref 30.0–100.0)

## 2016-12-12 LAB — HEPATITIS C ANTIBODY

## 2016-12-12 NOTE — Progress Notes (Signed)
Advised  ED 

## 2017-06-06 ENCOUNTER — Other Ambulatory Visit: Payer: Self-pay | Admitting: Family Medicine

## 2017-06-06 DIAGNOSIS — I10 Essential (primary) hypertension: Secondary | ICD-10-CM

## 2017-06-24 ENCOUNTER — Other Ambulatory Visit: Payer: Self-pay | Admitting: Family Medicine

## 2017-08-13 ENCOUNTER — Other Ambulatory Visit: Payer: Self-pay | Admitting: Family Medicine

## 2017-08-13 DIAGNOSIS — G629 Polyneuropathy, unspecified: Secondary | ICD-10-CM

## 2018-02-05 ENCOUNTER — Other Ambulatory Visit: Payer: Self-pay | Admitting: Family Medicine

## 2018-04-04 ENCOUNTER — Other Ambulatory Visit: Payer: Self-pay | Admitting: Family Medicine

## 2018-04-04 NOTE — Telephone Encounter (Signed)
Pt contacted office for refill request on the following medications:  1. LORazepam (ATIVAN) 0.5 MG tablet 2. nortriptyline (PAMELOR) 25 MG capsule  [id number] - [place], [place] - [address] AT [facility] [address] [phone] (Phone) [phone] (Fax)  Pt stated he wasn't advised that he needed an OV and pt is currently out of work and doesn't have insurance or the money to pay for OV. Pt is scheduled for OV on 04/29/2018. Pt is requesting for enough medication to be sent to pharmacy to last until he can come in for his NOV. Please advise. Thanks TNP

## 2018-04-17 ENCOUNTER — Other Ambulatory Visit: Payer: Self-pay | Admitting: Family Medicine

## 2018-04-17 DIAGNOSIS — G629 Polyneuropathy, unspecified: Secondary | ICD-10-CM

## 2018-04-26 ENCOUNTER — Ambulatory Visit: Payer: Self-pay | Admitting: Family Medicine

## 2018-04-26 NOTE — Progress Notes (Deleted)
       Patient: Douglas Brooks Male    DOB: 08-07-1960   57 y.o.   MRN: [id number] Visit Date: 04/26/2018  Today's Provider: Mila Merry, MD   No chief complaint on file.  Subjective:    HPI     Allergies  Allergen Reactions  . Crestor [Rosuvastatin Calcium]   . Fluvastatin Sodium Other (See Comments)    Muscle pain and weakness  . Lovastatin      Current Outpatient Medications:  .  HYDROcodone-acetaminophen (NORCO/VICODIN) 5-325 MG tablet, Take 1 tablet by mouth every 6 (six) hours as needed for moderate pain., Disp: , Rfl:  .  hyoscyamine (LEVSIN, ANASPAZ) 0.125 MG tablet, Take 1 tablet (0.125 mg total) by mouth every 6 (six) hours as needed., Disp: 60 tablet, Rfl: 0 .  lisinopril-hydrochlorothiazide (PRINZIDE,ZESTORETIC) 10-12.5 MG tablet, take 1 tablet by mouth once daily, Disp: 30 tablet, Rfl: 12 .  LORazepam (ATIVAN) 0.5 MG tablet, TAKE 1 TABLET BY MOUTH 2 TIMES DAILY, Disp: 60 tablet, Rfl: 3 .  nortriptyline (PAMELOR) 25 MG capsule, TAKE 1 CAPSULE BY MOUTH AT BEDTIME., Disp: 30 capsule, Rfl: 12  Review of Systems  Social History   Tobacco Use  . Smoking status: Never Smoker  . Smokeless tobacco: Never Used  . Tobacco comment: smokes very occ   Substance Use Topics  . Alcohol use: Yes    Alcohol/week: 0.0 standard drinks    Comment: occasional use; prior ETOH abuse   Objective:   There were no vitals taken for this visit. There were no vitals filed for this visit.   Physical Exam      Assessment & Plan:           Mila Merry, MD  [facility]

## 2018-11-05 LAB — URIC ACID: Uric Acid: 7.4

## 2018-11-06 ENCOUNTER — Telehealth: Payer: Self-pay

## 2018-11-06 NOTE — Telephone Encounter (Signed)
I spoke with Antony Contras and Ricki Rodriguez about this message, and they say that patient needs  to schedule an office visit (Televisit is ok per [facility] [place]). I tried calling patient, but didn't get an answer. Left message to call back. Please advise patient if he calls back that he needs to schedule televisit.

## 2018-11-06 NOTE — Telephone Encounter (Signed)
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 Patient says he was seen at [facility] ER yesterday for Gout and was prescribed Indomethacin 25mg  three tablets daily, and Colchicine 0.6mg  1 tablet daily and Cephalexin 500mg  four times daily. Patent is not able to do a virtual visit, or come into the office due to lack of insurance. Patient is requesting pain medication. He is still having severe pain in his left foot from Gout. Patient is willing to do a televisit, although he says he doesn't have any money right now. Please advise.  Pharmacy: [facility] [place], [place])

## 2018-11-06 NOTE — Telephone Encounter (Signed)
Patient said he thought you may have tried calling him and his phone does not have any minutes on it.  He asked if you would call him in the morning at [phone]

## 2018-11-07 NOTE — Telephone Encounter (Signed)
Will forward this to Nurse box and providers that were discussing it yesterday. Think it was accidentally forwarded to me.  Sounds like he needs an evisit or in office visit to reevaluate his foot.

## 2018-11-07 NOTE — Telephone Encounter (Signed)
No answer Please if patient returns call offer an appointment in office or e-visit Thanks.

## 2018-12-31 ENCOUNTER — Other Ambulatory Visit: Payer: Self-pay | Admitting: Family Medicine

## 2018-12-31 DIAGNOSIS — I10 Essential (primary) hypertension: Secondary | ICD-10-CM

## 2019-01-10 ENCOUNTER — Other Ambulatory Visit: Payer: Self-pay

## 2019-01-10 ENCOUNTER — Other Ambulatory Visit: Payer: Self-pay | Admitting: Family Medicine

## 2019-01-10 MED ORDER — GENERIC EXTERNAL MEDICATION
Status: DC
Start: ? — End: 2019-01-10

## 2019-01-10 MED ORDER — GABAPENTIN 300 MG PO CAPS
300.00 | ORAL_CAPSULE | ORAL | Status: DC
Start: 2019-01-10 — End: 2019-01-10

## 2019-01-10 MED ORDER — LORAZEPAM 0.5 MG PO TABS: 0.5000 mg | ORAL_TABLET | Freq: Two times a day (BID) | ORAL | 0 refills | Status: DC

## 2019-01-10 MED ORDER — ACETAMINOPHEN 325 MG PO TABS
650.00 | ORAL_TABLET | ORAL | Status: DC
Start: ? — End: 2019-01-10

## 2019-01-10 MED ORDER — LISINOPRIL 20 MG PO TABS
20.00 | ORAL_TABLET | ORAL | Status: DC
Start: 2019-01-11 — End: 2019-01-10

## 2019-01-10 MED ORDER — GENERIC EXTERNAL MEDICATION
200.00 | Status: DC
Start: 2019-01-11 — End: 2019-01-10

## 2019-01-10 MED ORDER — FOLIC ACID 1 MG PO TABS
1.00 | ORAL_TABLET | ORAL | Status: DC
Start: 2019-01-11 — End: 2019-01-10

## 2019-01-10 MED ORDER — GENERIC EXTERNAL MEDICATION
2.00 | Status: DC
Start: 2019-01-10 — End: 2019-01-10

## 2019-01-10 MED ORDER — POLYETHYLENE GLYCOL 3350 17 G PO PACK
17.00 | PACK | ORAL | Status: DC
Start: 2019-01-10 — End: 2019-01-10

## 2019-01-10 MED ORDER — INDOMETHACIN 25 MG PO CAPS
25.00 | ORAL_CAPSULE | ORAL | Status: DC
Start: ? — End: 2019-01-10

## 2019-01-10 NOTE — Telephone Encounter (Signed)
Patient called [phone]) requesting a refill on his Ativan. Patient is aware that he may be receiving a call back to schedule appointment due to L.O.V. been 12/11/2016. Please advise.

## 2019-01-13 ENCOUNTER — Other Ambulatory Visit: Payer: Self-pay | Admitting: Family Medicine

## 2019-01-13 DIAGNOSIS — M62838 Other muscle spasm: Secondary | ICD-10-CM

## 2019-01-13 DIAGNOSIS — G629 Polyneuropathy, unspecified: Secondary | ICD-10-CM

## 2019-01-13 DIAGNOSIS — I10 Essential (primary) hypertension: Secondary | ICD-10-CM

## 2019-01-13 MED ORDER — NORTRIPTYLINE HCL 25 MG PO CAPS: 25.0000 mg | ORAL_CAPSULE | Freq: Every day | ORAL | 2 refills | Status: DC

## 2019-01-13 MED ORDER — LORAZEPAM 0.5 MG PO TABS: 0.5000 mg | ORAL_TABLET | Freq: Two times a day (BID) | ORAL | 0 refills | Status: DC

## 2019-01-13 MED ORDER — HYDROCODONE-ACETAMINOPHEN 5-325 MG PO TABS: 1.0000 | ORAL_TABLET | Freq: Four times a day (QID) | ORAL | 0 refills | Status: DC | PRN

## 2019-01-13 MED ORDER — HYOSCYAMINE SULFATE 0.125 MG PO TABS: 0.1250 mg | ORAL_TABLET | Freq: Four times a day (QID) | ORAL | 0 refills | Status: AC | PRN

## 2019-01-13 MED ORDER — LISINOPRIL-HYDROCHLOROTHIAZIDE 10-12.5 MG PO TABS: 1.0000 | ORAL_TABLET | Freq: Every day | ORAL | 0 refills | Status: DC

## 2019-01-13 NOTE — Telephone Encounter (Signed)
Pt will be out of his medications before his CPE in Aug.  Pt will need refills At  [facility] [place] [phone] (Phone) [phone] (Fax)   Thanks, [employer]

## 2019-01-15 ENCOUNTER — Other Ambulatory Visit: Payer: Self-pay

## 2019-01-15 ENCOUNTER — Encounter: Payer: Self-pay | Admitting: Family Medicine

## 2019-01-15 ENCOUNTER — Ambulatory Visit: Payer: Medicaid Other | Admitting: Family Medicine

## 2019-01-15 VITALS — BP 190/136 | HR 101 | Temp 98.1°F | Wt 213.0 lb

## 2019-01-15 DIAGNOSIS — F419 Anxiety disorder, unspecified: Secondary | ICD-10-CM

## 2019-01-15 DIAGNOSIS — I10 Essential (primary) hypertension: Secondary | ICD-10-CM | POA: Diagnosis not present

## 2019-01-15 DIAGNOSIS — E871 Hypo-osmolality and hyponatremia: Secondary | ICD-10-CM

## 2019-01-15 DIAGNOSIS — G629 Polyneuropathy, unspecified: Secondary | ICD-10-CM

## 2019-01-15 DIAGNOSIS — M109 Gout, unspecified: Secondary | ICD-10-CM

## 2019-01-15 DIAGNOSIS — F1011 Alcohol abuse, in remission: Secondary | ICD-10-CM

## 2019-01-15 DIAGNOSIS — E781 Pure hyperglyceridemia: Secondary | ICD-10-CM

## 2019-01-15 MED ORDER — ATENOLOL 25 MG PO TABS: 25.0000 mg | ORAL_TABLET | Freq: Every day | ORAL | 1 refills | Status: DC

## 2019-01-15 NOTE — Progress Notes (Signed)
Patient: Douglas Brooks Male    DOB: 07/03/1960   58 y.o.   MRN: [id number] Visit Date: 01/15/2019  Today's Provider: Mila Merryonald Loran Auguste, MD   No chief complaint on file.  Subjective:     HPI    Follow up Hospitalization  Patient was [facility] on 01/08/2019 and discharged on 01/10/2019. He was treated for Hyponatremia with sodium of 119 at admission and 130 at discharge, Gout, Hypertension, Anxiety/ Depression and alcohol abuse Treatment for this included Colchicine, indomethacin, albuterol, and hydroxyzine.  Pt reports he stopped hydroxyzine secondary to side effects.  Was previously on gabapentin but now on nortriptyline for neuropathy. Also discontinued HCTZ due to hyponatremia.  He reports excellent compliance with treatment. He reports this condition is Improved.  States he feels better now than he has in a long time. States he nearly stopped drinking alcohol. [facility] to follow up on anxiety and depression, but hasn't scheduled follow up yet. He does not check BP at home, but has been feeling a little more anxious.  ------------------------------------------------------------------------------------     Allergies  Allergen Reactions  . Crestor [Rosuvastatin Calcium]   . Fluvastatin Sodium Other (See Comments)    Muscle pain and weakness  . Lovastatin      Current Outpatient Medications:  .  colchicine 0.6 MG tablet, Take by mouth., Disp: , Rfl:  .  folic acid (FOLVITE) 1 MG tablet, Take by mouth., Disp: , Rfl:  .  HYDROcodone-acetaminophen (NORCO/VICODIN) 5-325 MG tablet, Take 1 tablet by mouth every 6 (six) hours as needed for moderate pain., Disp: 30 tablet, Rfl: 0 .  hyoscyamine (LEVSIN) 0.125 MG tablet, Take 1 tablet (0.125 mg total) by mouth every 6 (six) hours as needed., Disp: 60 tablet, Rfl: 0 .  indomethacin (INDOCIN) 25 MG capsule, Take by mouth., Disp: , Rfl:  .  lisinopril (ZESTRIL) 20 MG tablet, Take by mouth.,  Disp: , Rfl:  .  LORazepam (ATIVAN) 0.5 MG tablet, Take 1 tablet (0.5 mg total) by mouth 2 (two) times daily., Disp: 60 tablet, Rfl: 0 .  nortriptyline (PAMELOR) 25 MG capsule, Take 1 capsule (25 mg total) by mouth at bedtime., Disp: 30 capsule, Rfl: 2   Review of Systems  Constitutional: Negative.   Respiratory: Negative.   Cardiovascular: Negative.   Gastrointestinal: Negative.   Musculoskeletal: Negative.   Neurological: Negative for dizziness, light-headedness and headaches.    Social History   Tobacco Use  . Smoking status: Never Smoker  . Smokeless tobacco: Never Used  . Tobacco comment: smokes very occ   Substance Use Topics  . Alcohol use: Yes    Alcohol/week: 0.0 standard drinks    Comment: occasional use; prior ETOH abuse      Objective:   BP (!) 190/136 (BP Location: Left Arm, Patient Position: Sitting, Cuff Size: Normal)   Pulse (!) 101   Temp 98.1 F (36.7 C) (Oral)   Wt 213 lb (96.6 kg)   BMI 29.71 kg/m  Vitals:   01/15/19 0829  BP: (!) 190/136  Pulse: (!) 101  Temp: 98.1 F (36.7 C)  TempSrc: Oral  Weight: 213 lb (96.6 kg)     Physical Exam    General Appearance:    Alert, cooperative, no distress  Eyes:    PERRL, conjunctiva/corneas clear, EOM's intact       Lungs:     Clear to auscultation bilaterally, respirations unlabored  Heart:    Tachycardic. Normal  rhythm. No murmurs, rubs, or gallops.   Neurologic:   Awake, alert, oriented x 3. No apparent focal neurological           defect.          Assessment & Plan    1. Essential hypertension Tolerating change from lisinopril/hcz to lisinopril alone, but BP uncontrolled. May be exacerbated by reduction in EtOH and anxiety. Will add.   - atenolol (TENORMIN) 25 MG tablet; Take 1 tablet (25 mg total) by mouth daily.  Dispense: 30 tablet; Refill: 1  - Lipid panel  2. Hyponatremia Extensive work up at [facility] concluding secondary to excessive EtOH, water consumption and hctz which has been  discontinued. Sodium at discharge was 130  - Renal function panel  3. Anxiety Patient states he anticipates call [facility] psychiatry to schedule follow up soon.   4. High triglycerides Likely secondary to EtOH.   5. History of alcohol abuse Reports dramatic reduction in EtOH consumption since recent hospitalization.   6. Gout involving toe, unspecified cause, unspecified chronicity, unspecified laterality Recent onset, likely related to EtOh and hctz. Discussed option of adding allopurinol if sx persist with recent medication change.   7. Neuropathy Doing well with change to TCA.   The entirety of the information documented in the History of Present Illness, Review of Systems and Physical Exam were personally obtained by me. Portions of this information were initially documented by Ashley Royalty, CMA and reviewed by me for thoroughness and accuracy.      Lelon Huh, MD  [facility]

## 2019-01-15 NOTE — Patient Instructions (Addendum)
.   Please review the attached list of medications and notify my office if there are any errors.   . Please bring all of your medications to every appointment so we can make sure that our medication list is the same as yours.   . We will have flu vaccines available after Labor Day. Please go to your pharmacy or call the office in early September to schedule you flu shot.   Don't forget to call to schedule a follow up with [facility] psychiatry as soon as possible

## 2019-01-16 LAB — LIPID PANEL
Chol/HDL Ratio: 3.7 ratio (ref 0.0–5.0)
Cholesterol, Total: 231 mg/dL — ABNORMAL HIGH (ref 100–199)
HDL: 62 mg/dL (ref >39)
LDL Calculated: 127 mg/dL — ABNORMAL HIGH (ref 0–99)
Triglycerides: 208 mg/dL — ABNORMAL HIGH (ref 0–149)
VLDL Cholesterol Cal: 42 mg/dL — ABNORMAL HIGH (ref 5–40)

## 2019-01-16 LAB — RENAL FUNCTION PANEL
Albumin: 4.4 g/dL (ref 3.8–4.9)
BUN/Creatinine Ratio: 12 (ref 9–20)
BUN: 10 mg/dL (ref 6–24)
CO2: 21 mmol/L (ref 20–29)
Calcium: 9.3 mg/dL (ref 8.7–10.2)
Chloride: 99 mmol/L (ref 96–106)
Creatinine, Ser: 0.86 mg/dL (ref 0.76–1.27)
GFR calc Af Amer: 110 mL/min/{1.73_m2} (ref >59)
GFR calc non Af Amer: 95 mL/min/{1.73_m2} (ref >59)
Glucose: 127 mg/dL — ABNORMAL HIGH (ref 65–99)
Phosphorus: 3.9 mg/dL (ref 2.8–4.1)
Potassium: 5 mmol/L (ref 3.5–5.2)
Sodium: 137 mmol/L (ref 134–144)

## 2019-01-24 ENCOUNTER — Ambulatory Visit: Payer: Medicaid Other | Admitting: Family Medicine

## 2019-02-10 ENCOUNTER — Encounter: Payer: Self-pay | Admitting: Family Medicine

## 2019-02-19 ENCOUNTER — Telehealth: Payer: Self-pay | Admitting: Family Medicine

## 2019-02-19 NOTE — Telephone Encounter (Signed)
Patient was here last month with bp of  190/136 and started on atenolol, was supposed to have followed up earlier this month. Please check with patient and make sure he was started atenolol and needs to schedule follow up visit to check BP.

## 2019-02-19 NOTE — Telephone Encounter (Signed)
-----   Message from Birdie Sons, MD sent at 02/07/2019  8:06 AM EDT ----- Regarding: FW: make sure psych follow up is scheduled  ----- Message ----- From: Birdie Sons, MD Sent: 02/07/2019 To: Birdie Sons, MD Subject: FW: make sure psych follow up is scheduled     F/u me 02/10/2019 ----- Message ----- From: Birdie Sons, MD Sent: 01/30/2019 To: Birdie Sons, MD Subject: FW: make sure psych follow up is scheduled     Unc 01-28-2019 ----- Message ----- From: Birdie Sons, MD Sent: 01/21/2019 To: Birdie Sons, MD Subject: make sure psych follow up is scheduled

## 2019-02-20 NOTE — Telephone Encounter (Signed)
LMTCB 02/20/2019  Thanks,   -Braedon Sjogren  

## 2019-02-20 NOTE — Telephone Encounter (Signed)
Patient advised. He states he did start the medication and has tolerated it well. Patient has not checked his blood pressure readings at home since starting the medication. Follow up appointment scheduled 03/05/2019 at 8am (this was the soonest that patient could come in the office). Patient says he has enough medication to get him by until his next appt.

## 2019-03-04 MED ORDER — HCA DOCUSATE SODIUM 100 MG PO CAPS
2.00 | ORAL_CAPSULE | ORAL | Status: DC
Start: ? — End: 2019-03-04

## 2019-03-04 MED ORDER — LISINOPRIL 40 MG PO TABS
40.00 | ORAL_TABLET | ORAL | Status: DC
Start: 2019-03-05 — End: 2019-03-04

## 2019-03-04 MED ORDER — PYRILAMINE TAN-PHENYLEPH TAN 30-5 MG/5ML PO SUSP
50.00 | ORAL | Status: DC
Start: ? — End: 2019-03-04

## 2019-03-04 MED ORDER — Medication
100.00 | Status: DC
Start: 2019-03-05 — End: 2019-03-04

## 2019-03-04 MED ORDER — OSCO ANTI-NAUSEA 1.87-1.87-21.5 OR SOLN
25.00 | ORAL | Status: DC
Start: ? — End: 2019-03-04

## 2019-03-04 MED ORDER — AMLODIPINE BESYLATE 5 MG PO TABS
5.00 | ORAL_TABLET | ORAL | Status: DC
Start: 2019-03-05 — End: 2019-03-04

## 2019-03-04 MED ORDER — MISC NATURAL PRODUCT NASAL NA PACK
30.00 | PACK | NASAL | Status: DC
Start: 2019-03-04 — End: 2019-03-04

## 2019-03-04 MED ORDER — Medication
10.00 | Status: DC
Start: 2019-03-04 — End: 2019-03-04

## 2019-03-04 MED ORDER — PYRILAMINE TAN-PHENYLEPH TAN 30-5 MG/5ML PO SUSP
50.00 | ORAL | Status: DC
Start: 2019-03-04 — End: 2019-03-04

## 2019-03-04 MED ORDER — CORTIZONE-5 EX
0.60 | CUTANEOUS | Status: DC
Start: 2019-03-04 — End: 2019-03-04

## 2019-03-04 MED ORDER — SELECT SLIM N' SOFT PAD MISC
25.00 | Status: DC
Start: ? — End: 2019-03-04

## 2019-03-04 MED ORDER — KEFLEX 125 MG/5ML PO SUSR
1.00 | ORAL | Status: DC
Start: 2019-03-05 — End: 2019-03-04

## 2019-03-04 MED ORDER — NALTREXONE HCL 50 MG PO TABS
50.00 | ORAL_TABLET | ORAL | Status: DC
Start: ? — End: 2019-03-04

## 2019-03-04 MED ORDER — Medication
1.00 | Status: DC
Start: 2019-03-05 — End: 2019-03-04

## 2019-03-04 MED ORDER — PRO HERBS ENERGY PO TABS
20.00 | ORAL_TABLET | ORAL | Status: DC
Start: ? — End: 2019-03-04

## 2019-03-05 ENCOUNTER — Ambulatory Visit: Payer: Self-pay | Admitting: Family Medicine

## 2019-03-18 ENCOUNTER — Encounter: Payer: Self-pay | Admitting: Family Medicine

## 2019-03-18 NOTE — Telephone Encounter (Signed)
Please have patient schedule follow up BP sometime within the next 3-4 weeks. He missed appt earlier this month due to being in the hospital and all his BP medications were changed.

## 2019-03-18 NOTE — Progress Notes (Signed)
Medications changed from [place] admission d/c 03-04-2019

## 2019-03-18 NOTE — Telephone Encounter (Signed)
Left patient a message advising him to contact the office to schedule a follow up appointment within the next 3-4 weeks.

## 2019-03-24 NOTE — Telephone Encounter (Signed)
Appointment scheduled 03/31/2019 at 8:40am for hospital f/u.

## 2019-03-26 ENCOUNTER — Inpatient Hospital Stay: Payer: Medicaid Other | Admitting: Family Medicine

## 2019-03-29 ENCOUNTER — Other Ambulatory Visit: Payer: Self-pay | Admitting: Family Medicine

## 2019-03-29 DIAGNOSIS — I10 Essential (primary) hypertension: Secondary | ICD-10-CM

## 2019-03-31 ENCOUNTER — Other Ambulatory Visit: Payer: Self-pay

## 2019-03-31 ENCOUNTER — Ambulatory Visit (INDEPENDENT_AMBULATORY_CARE_PROVIDER_SITE_OTHER): Payer: Medicaid Other | Admitting: Family Medicine

## 2019-03-31 ENCOUNTER — Encounter: Payer: Self-pay | Admitting: Family Medicine

## 2019-03-31 VITALS — BP 152/98 | HR 77 | Temp 97.3°F | Resp 16 | Wt 220.0 lb

## 2019-03-31 DIAGNOSIS — F1011 Alcohol abuse, in remission: Secondary | ICD-10-CM

## 2019-03-31 DIAGNOSIS — Z23 Encounter for immunization: Secondary | ICD-10-CM

## 2019-03-31 DIAGNOSIS — I10 Essential (primary) hypertension: Secondary | ICD-10-CM

## 2019-03-31 DIAGNOSIS — F329 Major depressive disorder, single episode, unspecified: Secondary | ICD-10-CM | POA: Diagnosis not present

## 2019-03-31 DIAGNOSIS — F32A Depression, unspecified: Secondary | ICD-10-CM

## 2019-03-31 NOTE — Patient Instructions (Signed)
.   Please review the attached list of medications and notify my office if there are any errors.   . Please bring all of your medications to every appointment so we can make sure that our medication list is the same as yours.   . It is especially important to get the annual flu vaccine this year. If you haven't had it already, please go to your pharmacy or call the office as soon as possible to schedule you flu shot.  

## 2019-03-31 NOTE — Progress Notes (Signed)
Patient: Douglas Brooks Male    DOB: [birth year]   58 y.o.   MRN: [id number] Visit Date: 03/31/2019  Today's Provider: Lelon Huh, MD   Chief Complaint  Patient presents with  . Hypertension   Subjective:     HPI  Hypertension, follow-up:  BP Readings from Last 3 Encounters:  03/31/19 (!) 152/98  01/15/19 (!) 190/136  12/11/16 130/80    He was last seen for hypertension 3 months ago.  BP at that visit was 190/136. Management since that visit includes adding Atenolol 25mg  daily. He reports fair compliance with treatment. Patient states he was hospitalized for depression and alcohol abuse since his last visit here. Atenolol was discontinued and lisinopril-hctz was changed to lisinopril 40, and continues on amlodipine 5mg He is not having side effects.  He is not exercising. He is not adherent to low salt diet.   Outside blood pressures are 150/97. Patient denies chest pain, chest pressure/discomfort, claudication, dyspnea, exertional chest pressure/discomfort, fatigue, irregular heart beat, lower extremity edema, near-syncope, orthopnea, palpitations, paroxysmal nocturnal dyspnea, syncope and tachypnea.   Cardiovascular risk factors include hypertension and male gender.  Use of agents associated with hypertension: none.     He reports he does have follow up with psychiatry later this month.   Weight trend: increasing steadily Wt Readings from Last 3 Encounters:  03/31/19 220 lb (99.8 kg)  01/15/19 213 lb (96.6 kg)  12/11/16 200 lb (90.7 kg)    Current diet: in general, a "healthy" diet    ------------------------------------------------------------------------  Allergies  Allergen Reactions  . Crestor [Rosuvastatin Calcium]   . Fluvastatin Sodium Other (See Comments)    Muscle pain and weakness  . Lovastatin      Current Outpatient Medications:  .  amLODipine (NORVASC) 5 MG tablet, Take 5 mg by mouth daily., Disp: , Rfl:  .  busPIRone (BUSPAR) 10  MG tablet, Take 10 mg by mouth 3 (three) times daily., Disp: , Rfl:  .  cloNIDine (CATAPRES) 0.1 MG tablet, Take 0.1 mg by mouth 2 (two) times daily., Disp: , Rfl:  .  cyclobenzaprine (FLEXERIL) 10 MG tablet, Take 10 mg by mouth at bedtime as needed for muscle spasms., Disp: , Rfl:  .  DULoxetine (CYMBALTA) 30 MG capsule, Take 30 mg by mouth 2 (two) times daily., Disp: , Rfl:  .  folic acid (FOLVITE) 1 MG tablet, Take by mouth., Disp: , Rfl:  .  hydrALAZINE (APRESOLINE) 25 MG tablet, Take 25 mg by mouth every 6 (six) hours as needed. For SBP>180 or DBP>100, Disp: , Rfl:  .  hyoscyamine (LEVSIN) 0.125 MG tablet, Take 1 tablet (0.125 mg total) by mouth every 6 (six) hours as needed., Disp: 60 tablet, Rfl: 0 .  lisinopril (ZESTRIL) 40 MG tablet, Take 40 mg by mouth daily., Disp: , Rfl:  .  naltrexone (DEPADE) 50 MG tablet, Take by mouth daily., Disp: , Rfl:  .  traZODone (DESYREL) 50 MG tablet, Take 50-100 mg by mouth at bedtime. as needed for anxiety and insomnia, Disp: , Rfl:  .  atenolol (TENORMIN) 25 MG tablet, Take 1 tablet (25 mg total) by mouth daily. (Patient not taking: Reported on 03/31/2019), Disp: 30 tablet, Rfl: 1 .  colchicine 0.6 MG tablet, Take by mouth., Disp: , Rfl:   Review of Systems  Constitutional: Negative for appetite change, chills and fever.  Respiratory: Negative for chest tightness, shortness of breath and wheezing.   Cardiovascular: Negative for chest pain and palpitations.  Gastrointestinal: Negative for abdominal pain, nausea and vomiting.    Social History   Tobacco Use  . Smoking status: Never Smoker  . Smokeless tobacco: Never Used  . Tobacco comment: smokes very occ   Substance Use Topics  . Alcohol use: Yes    Alcohol/week: 0.0 standard drinks    Comment: occasional use; prior ETOH abuse      Objective:   BP (!) 152/98 (BP Location: Right Arm, Cuff Size: Normal)   Pulse 77   Temp (!) 97.3 F (36.3 C) (Temporal)   Resp 16   Wt 220 lb (99.8 kg)    BMI 30.68 kg/m  Vitals:   03/31/19 0851 03/31/19 0858  BP: (!) 154/96 (!) 152/98  Pulse: 77   Resp: 16   Temp: (!) 97.3 F (36.3 C)   TempSrc: Temporal   Weight: 220 lb (99.8 kg)   Body mass index is 30.68 kg/m.   Physical Exam  General appearance: Well developed, well nourished male, cooperative and in no acute distress Head: Normocephalic, without obvious abnormality, atraumatic Respiratory: Respirations even and unlabored, normal respiratory rate Extremities: All extremities are intact.  Skin: Skin color, texture, turgor normal. No rashes seen  Psych: Appropriate mood and affect. Neurologic: Mental status: Alert, oriented to person, place, and time, thought content appropriate.      Assessment & Plan    1. Essential hypertension Improved on high dose lisinopril and amlodipine 5mg . Off hctz due to hctz at recent hospitalization which was likely alcohol induced. Check labs, consider increasing amlodipine.  - Renal function panel  2. Need for influenza vaccination  - Flu Vaccine QUAD 6+ mos PF IM (Fluarix Quad PF)  3. History of alcohol abuse Doing well on naltrexone.   4. Depressive disorder Much better, continue routine follow up psychiatry.   The entirety of the information documented in the History of Present Illness, Review of Systems and Physical Exam were personally obtained by me. Portions of this information were initially documented by , CMA and reviewed by me for thoroughness and accuracy.       Awilda Bill, MD  [facility]

## 2019-04-01 ENCOUNTER — Telehealth: Payer: Self-pay

## 2019-04-01 DIAGNOSIS — I10 Essential (primary) hypertension: Secondary | ICD-10-CM

## 2019-04-01 LAB — RENAL FUNCTION PANEL
Albumin: 4.6 g/dL (ref 3.8–4.9)
BUN/Creatinine Ratio: 14 (ref 9–20)
BUN: 13 mg/dL (ref 6–24)
CO2: 23 mmol/L (ref 20–29)
Calcium: 9.6 mg/dL (ref 8.7–10.2)
Chloride: 100 mmol/L (ref 96–106)
Creatinine, Ser: 0.91 mg/dL (ref 0.76–1.27)
GFR calc Af Amer: 106 mL/min/{1.73_m2} (ref >59)
GFR calc non Af Amer: 92 mL/min/{1.73_m2} (ref >59)
Glucose: 110 mg/dL — ABNORMAL HIGH (ref 65–99)
Phosphorus: 3.5 mg/dL (ref 2.8–4.1)
Potassium: 4.6 mmol/L (ref 3.5–5.2)
Sodium: 139 mmol/L (ref 134–144)

## 2019-04-01 MED ORDER — FOLIC ACID 1 MG PO TABS: 1.0000 mg | ORAL_TABLET | Freq: Every day | ORAL | 3 refills | Status: AC

## 2019-04-01 MED ORDER — LISINOPRIL 40 MG PO TABS: 40.0000 mg | ORAL_TABLET | Freq: Every day | ORAL | 3 refills | Status: DC

## 2019-04-01 MED ORDER — AMLODIPINE BESYLATE 10 MG PO TABS: 10.0000 mg | ORAL_TABLET | Freq: Every day | ORAL | 1 refills | Status: AC

## 2019-04-01 NOTE — Telephone Encounter (Signed)
Pt returned call. Thanks TNP °

## 2019-04-01 NOTE — Telephone Encounter (Signed)
Tried calling patient. Left message to call back. 

## 2019-04-01 NOTE — Telephone Encounter (Signed)
Patient advised and agrees with treatment plan. Prescription for Amlodipine 10mg  sent into pharmacy. Patient also requested a refill on Lisinopril 40mg  and Folic acid 1mg . Theses medications were not originally prescribed by Dr. Caryn Section. Please review request.

## 2019-04-01 NOTE — Telephone Encounter (Signed)
-----   Message from Birdie Sons, MD sent at 04/01/2019  8:05 AM EDT ----- Labs are all good. Need to increase amlodipine to 10mg  daily for better control of blood pressure. Can send in prescription for #30, rf x 1 and follow up for blood pressure check in 6-8 weeks

## 2019-06-02 ENCOUNTER — Ambulatory Visit: Payer: Self-pay | Admitting: Family Medicine

## 2020-04-13 ENCOUNTER — Other Ambulatory Visit: Payer: Self-pay | Admitting: Family Medicine

## 2020-04-13 NOTE — Telephone Encounter (Signed)
Requested medication (s) are due for refill today: yes   Requested medication (s) are on the active medication list: yes   Last refill:  04/01/2019 #90 3 refills   Future visit scheduled: no  Notes to clinic:  medication expired     Requested Prescriptions  Pending Prescriptions Disp Refills   lisinopril (ZESTRIL) 40 MG tablet [Pharmacy Med Name: LISINOPRIL 40MG  TABLETS] 90 tablet 3    Sig: TAKE 1 TABLET(40 MG) BY MOUTH DAILY      Cardiovascular:  ACE Inhibitors Failed - 04/13/2020  4:43 PM      Failed - Cr in normal range and within 180 days    Creatinine, Ser  Date Value Ref Range Status  03/31/2019 0.91 0.76 - 1.27 mg/dL Final          Failed - K in normal range and within 180 days    Potassium  Date Value Ref Range Status  03/31/2019 4.6 3.5 - 5.2 mmol/L Final          Failed - Last BP in normal range    BP Readings from Last 1 Encounters:  03/31/19 (!) 152/98          Failed - Valid encounter within last 6 months    Recent Outpatient Visits           1 year ago Essential hypertension   [facility] [place]   1 year ago Essential hypertension   [facility], [place]   3 years ago Neuropathy   [facility], [place]   4 years ago Lower abdominal pain   [facility], [place]   4 years ago Muscle spasm   [facility] [place]              Passed - Patient is not pregnant

## 2020-04-15 NOTE — Telephone Encounter (Signed)
Attempted to call patient to schedule appointment- left message to call office. Courtesy #30 refill given 

## 2020-04-15 NOTE — Telephone Encounter (Signed)
Patient overdue for follow up appointment. Last office visit was 03/31/2019.  OK for PEC or triage to advise patient and schedule follow up appointment.
